# Patient Record
Sex: Male | Born: 1990 | Race: White | Hispanic: No | Marital: Married | Smoking: Never smoker
Health system: Southern US, Community
[De-identification: ages and names within clinical notes are randomized; demographics above are authoritative.]

## PROBLEM LIST (undated history)

## (undated) DIAGNOSIS — K219 Gastro-esophageal reflux disease without esophagitis: Secondary | ICD-10-CM

## (undated) DIAGNOSIS — Z8619 Personal history of other infectious and parasitic diseases: Secondary | ICD-10-CM

## (undated) DIAGNOSIS — K900 Celiac disease: Secondary | ICD-10-CM

## (undated) DIAGNOSIS — J45909 Unspecified asthma, uncomplicated: Secondary | ICD-10-CM

## (undated) HISTORY — DX: Unspecified asthma, uncomplicated: J45.909

## (undated) HISTORY — PX: WISDOM TOOTH EXTRACTION: SHX21

## (undated) HISTORY — DX: Celiac disease: K90.0

## (undated) HISTORY — PX: NO PAST SURGERIES: SHX2092

## (undated) HISTORY — DX: Personal history of other infectious and parasitic diseases: Z86.19

## (undated) HISTORY — DX: Gastro-esophageal reflux disease without esophagitis: K21.9

---
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
## 2013-10-22 ENCOUNTER — Ambulatory Visit (INDEPENDENT_AMBULATORY_CARE_PROVIDER_SITE_OTHER): Payer: 59 | Admitting: Family Medicine

## 2013-10-22 VITALS — BP 130/80 | HR 70 | Temp 99.3°F | Resp 16 | Ht 68.0 in | Wt 162.0 lb

## 2013-10-22 DIAGNOSIS — Z0000 Encounter for general adult medical examination without abnormal findings: Secondary | ICD-10-CM

## 2013-10-22 NOTE — Patient Instructions (Signed)
Cleared for participation

## 2013-10-22 NOTE — Progress Notes (Signed)
   Subjective:    Patient ID: Aaron Ramsey, male    DOB: March 31, 1991, 23 y.o.   MRN: [id number]  HPI Patient presents for employment physical for [employer]. No other concerns.  PMH: None PSH: Wisdom teeth Meds: None Allergies: NKDA FH: No DM, HTN, HLD, heart disease, stroke SH: Patient is currently single but is engaged. No tobacco. No alcohol. No illicit drug use. No concerns for STI and declines STD testing. Currently exercises by doing martial arts 4-5 days per week Diet: Lots of fruits and veggies, well balanced, does consume approx 4 sugary beverages per day.  Review of Systems  All other systems reviewed and are negative.       Objective:   Physical Exam  Constitutional: He is oriented to person, place, and time. He appears well-developed and well-nourished. No distress.  HENT:  Head: Normocephalic and atraumatic.  Right Ear: External ear normal.  Left Ear: External ear normal.  Mouth/Throat: Oropharynx is clear and moist. No oropharyngeal exudate.  Eyes: Conjunctivae are normal. Pupils are equal, round, and reactive to light. No scleral icterus.  Neck: Normal range of motion. Neck supple. No thyromegaly present.  Cardiovascular: Normal rate, regular rhythm, normal heart sounds and intact distal pulses.   No murmur heard. Pulmonary/Chest: Effort normal and breath sounds normal. No respiratory distress.  Abdominal: Soft. Bowel sounds are normal. He exhibits no distension and no mass. There is no tenderness. There is no rebound and no guarding.  Musculoskeletal: Normal range of motion.  Lymphadenopathy:    He has no cervical adenopathy.  Neurological: He is alert and oriented to person, place, and time.  Skin: Skin is warm and dry. He is not diaphoretic.  Psychiatric: He has a normal mood and affect. His behavior is normal.      Assessment & Plan:  #1. Pre-employment physical - Cleared for participation - Normal exam today - Continue exercise - Decrease  sugary beverages - No labs at this time given age and lack of risk factors. Would recommend labs such as FLP and A1c starting around age 45.

## 2013-10-23 NOTE — Progress Notes (Signed)
Reviewed documentation and agree w/ assessment and plan. Delman Cheadle, MD MPH

## 2017-10-09 ENCOUNTER — Ambulatory Visit: Payer: Self-pay | Admitting: *Deleted

## 2017-10-09 NOTE — Telephone Encounter (Signed)
Pt states he has chronic stomach issues and has been dealing with this issue for approximately 6-8 years. Pt states pain has increased in severity over the last 4 days and describes pain as a stabbing in the center of the lower abdomen. Pt states he has nausea and feels lightheaded at times but he does not experience any changes in vision and is able to walk without difficulty. Pt states that eating seems to make pain worse, but he has been able to tolerate food with out vomiting. Pt states he has diarrhea 5-7 times/day and states he has had this issue off and on for the past 6-8 years as well. Pt has not been seen by a physician regarding current issues and has not been diagnosed with a disease related to stomach issues. New pt appt made for 1/18. Pt advised of home care measures such as Pepto Bismol and Imodium to help diarrhea. Pt advised to seek treatment in ED if pain begins to worsen or if new symptoms develop. Pt verbalized understanding.  Reason for Disposition . Abdominal pain is a chronic symptom (recurrent or ongoing AND present > 4 weeks)  Answer Assessment - Initial Assessment Questions 1. LOCATION: "Where does it hurt?"      Lower stomach in the center 2. RADIATION: "Does the pain shoot anywhere else?" (e.g., chest, back)     No 3. ONSET: "When did the pain begin?" (Minutes, hours or days ago)      Approximately 4 days ago 4. SUDDEN: "Gradual or sudden onset?"     Sudden onset 5. PATTERN "Does the pain come and go, or is it constant?"    - If constant: "Is it getting better, staying the same, or worsening?"      (Note: Constant means the pain never goes away completely; most serious pain is constant and it progresses)     - If intermittent: "How long does it last?" "Do you have pain now?"     (Note: Intermittent means the pain goes away completely between bouts)     Comes and goes 6. SEVERITY: "How bad is the pain?"  (e.g., Scale 1-10; mild, moderate, or severe)    - MILD (1-3):  doesn't interfere with normal activities, abdomen soft and not tender to touch     - MODERATE (4-7): interferes with normal activities or awakens from sleep, tender to touch     - SEVERE (8-10): excruciating pain, doubled over, unable to do any normal activities       Moderate 6 7. RECURRENT SYMPTOM: "Have you ever had this type of abdominal pain before?" If so, ask: "When was the last time?" and "What happened that time?"      Yes has been dealing with for the last 6-8 8. CAUSE: "What do you think is causing the abdominal pain?"     Unsure 9. RELIEVING/AGGRAVATING FACTORS: "What makes it better or worse?" (e.g., movement, antacids, bowel movement)     No 10. OTHER SYMPTOMS: "Has there been any vomiting, diarrhea, constipation, or urine problems?"       Diarrhea 5-7/day  Protocols used: ABDOMINAL PAIN - MALE-A-AH

## 2017-10-12 ENCOUNTER — Encounter: Payer: Self-pay | Admitting: Physician Assistant

## 2017-10-12 ENCOUNTER — Ambulatory Visit: Payer: BLUE CROSS/BLUE SHIELD | Admitting: Physician Assistant

## 2017-10-12 ENCOUNTER — Other Ambulatory Visit: Payer: Self-pay

## 2017-10-12 ENCOUNTER — Encounter: Payer: Self-pay | Admitting: Gastroenterology

## 2017-10-12 VITALS — BP 118/70 | HR 65 | Temp 98.4°F | Resp 14 | Ht 68.0 in | Wt 141.0 lb

## 2017-10-12 DIAGNOSIS — Z0000 Encounter for general adult medical examination without abnormal findings: Secondary | ICD-10-CM

## 2017-10-12 DIAGNOSIS — R1084 Generalized abdominal pain: Secondary | ICD-10-CM

## 2017-10-12 DIAGNOSIS — G8929 Other chronic pain: Secondary | ICD-10-CM | POA: Diagnosis not present

## 2017-10-12 LAB — LIPID PANEL
Cholesterol: 127 mg/dL (ref 0–200)
HDL: 44 mg/dL (ref >39.00)
LDL Cholesterol: 61 mg/dL (ref 0–99)
NonHDL: 82.91
Total CHOL/HDL Ratio: 3
Triglycerides: 109 mg/dL (ref 0.0–149.0)
VLDL: 21.8 mg/dL (ref 0.0–40.0)

## 2017-10-12 LAB — POCT URINALYSIS DIPSTICK
Bilirubin, UA: NEGATIVE
Blood, UA: NEGATIVE
Glucose, UA: NEGATIVE
Ketones, UA: NEGATIVE
Leukocytes, UA: NEGATIVE
Nitrite, UA: NEGATIVE
Protein, UA: NEGATIVE
Spec Grav, UA: 1.025 (ref 1.010–1.025)
Urobilinogen, UA: 0.2 E.U./dL
pH, UA: 5 (ref 5.0–8.0)

## 2017-10-12 LAB — CBC WITH DIFFERENTIAL/PLATELET
Basophils Absolute: 0 10*3/uL (ref 0.0–0.1)
Basophils Relative: 0.6 % (ref 0.0–3.0)
Eosinophils Absolute: 0.2 10*3/uL (ref 0.0–0.7)
Eosinophils Relative: 2.5 % (ref 0.0–5.0)
HCT: 43.6 % (ref 39.0–52.0)
Hemoglobin: 14.8 g/dL (ref 13.0–17.0)
Lymphocytes Relative: 37.2 % (ref 12.0–46.0)
Lymphs Abs: 2.3 10*3/uL (ref 0.7–4.0)
MCHC: 34 g/dL (ref 30.0–36.0)
MCV: 88.1 fl (ref 78.0–100.0)
Monocytes Absolute: 0.8 10*3/uL (ref 0.1–1.0)
Monocytes Relative: 12.7 % — ABNORMAL HIGH (ref 3.0–12.0)
Neutro Abs: 2.9 10*3/uL (ref 1.4–7.7)
Neutrophils Relative %: 47 % (ref 43.0–77.0)
Platelets: 203 10*3/uL (ref 150.0–400.0)
RBC: 4.95 Mil/uL (ref 4.22–5.81)
RDW: 13 % (ref 11.5–15.5)
WBC: 6.2 10*3/uL (ref 4.0–10.5)

## 2017-10-12 LAB — COMPREHENSIVE METABOLIC PANEL
ALT: 9 U/L (ref 0–53)
AST: 13 U/L (ref 0–37)
Albumin: 4.7 g/dL (ref 3.5–5.2)
Alkaline Phosphatase: 71 U/L (ref 39–117)
BUN: 15 mg/dL (ref 6–23)
CO2: 35 mEq/L — ABNORMAL HIGH (ref 19–32)
Calcium: 9.5 mg/dL (ref 8.4–10.5)
Chloride: 99 mEq/L (ref 96–112)
Creatinine, Ser: 0.92 mg/dL (ref 0.40–1.50)
GFR: 106.61 mL/min (ref >60.00)
Glucose, Bld: 80 mg/dL (ref 70–99)
Potassium: 4 mEq/L (ref 3.5–5.1)
Sodium: 139 mEq/L (ref 135–145)
Total Bilirubin: 0.5 mg/dL (ref 0.2–1.2)
Total Protein: 7 g/dL (ref 6.0–8.3)

## 2017-10-12 LAB — LIPASE: Lipase: 25 U/L (ref 11.0–59.0)

## 2017-10-12 LAB — HEMOGLOBIN A1C: Hgb A1c MFr Bld: 5.5 % (ref 4.6–6.5)

## 2017-10-12 LAB — TSH: TSH: 1.23 u[IU]/mL (ref 0.35–4.50)

## 2017-10-12 MED ORDER — PANTOPRAZOLE SODIUM 40 MG PO TBEC: 40.0000 mg | DELAYED_RELEASE_TABLET | Freq: Every day | ORAL | 3 refills | Status: DC

## 2017-10-12 NOTE — Assessment & Plan Note (Signed)
Labs today to include CBC w diff, CMP, Lipase. Referral to GI placed. Will start 2 week trial of Pantoprazole to help with suspected silent GERD.

## 2017-10-12 NOTE — Progress Notes (Signed)
Patient presents to clinic today to establish care. Is requesting CPE today.  Chronic Issues: Patient endorses long-standing history of loose, frequent stools with cramping in the central abdomen. States this was intermittent and associated with fecal urgency, needing to rush to the bathroom after eating. Denies heart burn or indigestion. Denies melena or hematochezia. Denies tenesmus. Endorses exacerbation of symptoms 4 days ago, endorses severe pain described as a stabbing pain. Noted some pain with palpation in epigastric region but otherwise pain was in lower abdomen. Does note a history of mucoid stools a few years ago. Has never had a workup.   Health Maintenance: Immunizations -- up-to-date per patient.   Past Medical History:  Diagnosis Date  . History of chickenpox     Past Surgical History:  Procedure Laterality Date  . NO PAST SURGERIES      No current outpatient medications on file prior to visit.   No current facility-administered medications on file prior to visit.     No Known Allergies  Family History  Problem Relation Age of Onset  . Healthy Mother   . Healthy Father   . Healthy Brother   . Healthy Brother   . Cancer Maternal Grandfather        Prostate    Social History   Socioeconomic History  . Marital status: Married    Spouse name: Not on file  . Number of children: Not on file  . Years of education: Not on file  . Highest education level: Not on file  Social Needs  . Financial resource strain: Not on file  . Food insecurity - worry: Not on file  . Food insecurity - inability: Not on file  . Transportation needs - medical: Not on file  . Transportation needs - non-medical: Not on file  Occupational History  . Not on file  Tobacco Use  . Smoking status: Never Smoker  . Smokeless tobacco: Never Used  Substance and Sexual Activity  . Alcohol use: Yes    Comment: social  . Drug use: No  . Sexual activity: Yes    Partners: Female   Comment: Wife  Other Topics Concern  . Not on file  Social History Narrative  . Not on file   Review of Systems  Constitutional: Negative for fever, malaise/fatigue and weight loss.  HENT: Negative for ear discharge, ear pain, hearing loss and tinnitus.   Eyes: Negative for blurred vision, double vision, photophobia and pain.  Respiratory: Negative for cough and shortness of breath.   Cardiovascular: Negative for chest pain and palpitations.  Gastrointestinal: Positive for abdominal pain and diarrhea. Negative for blood in stool, constipation, heartburn, melena, nausea and vomiting.  Genitourinary: Negative for dysuria, flank pain, frequency, hematuria and urgency.  Musculoskeletal: Negative for falls.  Neurological: Negative for dizziness, loss of consciousness and headaches.  Endo/Heme/Allergies: Negative for environmental allergies.  Psychiatric/Behavioral: Negative for depression, hallucinations, substance abuse and suicidal ideas. The patient is not nervous/anxious and does not have insomnia.    BP 118/70   Pulse 65   Temp 98.4 F (36.9 C) (Oral)   Resp 14   Ht 5' 8"  (1.727 m)   Wt 141 lb (64 kg)   SpO2 98%   BMI 21.44 kg/m   Physical Exam  Constitutional: He is oriented to person, place, and time and well-developed, well-nourished, and in no distress.  HENT:  Head: Normocephalic and atraumatic.  Right Ear: External ear normal.  Left Ear: External ear normal.  Nose: Nose normal.  Mouth/Throat: Oropharynx is clear and moist. No oropharyngeal exudate.  Eyes: Conjunctivae and EOM are normal. Pupils are equal, round, and reactive to light.  Neck: Neck supple. No thyromegaly present.  Cardiovascular: Normal rate, regular rhythm, normal heart sounds and intact distal pulses.  Pulmonary/Chest: Effort normal and breath sounds normal. No respiratory distress. He has no wheezes. He has no rales. He exhibits no tenderness.  Abdominal: Soft. Bowel sounds are normal. He exhibits no  distension and no mass. There is tenderness in the epigastric area and left upper quadrant. There is no rebound and no guarding.  Genitourinary: Testes/scrotum normal and penis normal. No discharge found.  Lymphadenopathy:    He has no cervical adenopathy.  Neurological: He is alert and oriented to person, place, and time.  Skin: Skin is warm and dry. No rash noted.  Psychiatric: Affect normal.  Vitals reviewed.  Assessment/Plan: Visit for preventive health examination Depression screen negative. Health Maintenance reviewed. Preventive schedule discussed and handout given in AVS. Will obtain fasting labs today.   Abdominal pain, chronic, generalized Labs today to include CBC w diff, CMP, Lipase. Referral to GI placed. Will start 2 week trial of Pantoprazole to help with suspected silent GERD.    Leeanne Rio, PA-C

## 2017-10-12 NOTE — Patient Instructions (Signed)
Please go to the lab today for blood work.  I will call you with your results. We will alter treatment regimen(s) if indicated by your results.   I want you to start a trial of Pantoprazole for 2 weeks to see if it starts to help with the upper abdominal tenderness.  I am getting you in with a Gastroenterologist for further assessment.    Preventive Care 18-39 Years, Male Preventive care refers to lifestyle choices and visits with your health care provider that can promote health and wellness. What does preventive care include?  A yearly physical exam. This is also called an annual well check.  Dental exams once or twice a year.  Routine eye exams. Ask your health care provider how often you should have your eyes checked.  Personal lifestyle choices, including: ? Daily care of your teeth and gums. ? Regular physical activity. ? Eating a healthy diet. ? Avoiding tobacco and drug use. ? Limiting alcohol use. ? Practicing safe sex. What happens during an annual well check? The services and screenings done by your health care provider during your annual well check will depend on your age, overall health, lifestyle risk factors, and family history of disease. Counseling Your health care provider may ask you questions about your:  Alcohol use.  Tobacco use.  Drug use.  Emotional well-being.  Home and relationship well-being.  Sexual activity.  Eating habits.  Work and work Statistician.  Screening You may have the following tests or measurements:  Height, weight, and BMI.  Blood pressure.  Lipid and cholesterol levels. These may be checked every 5 years starting at age 73.  Diabetes screening. This is done by checking your blood sugar (glucose) after you have not eaten for a while (fasting).  Skin check.  Hepatitis C blood test.  Hepatitis B blood test.  Sexually transmitted disease (STD) testing.  Discuss your test results, treatment options, and if  necessary, the need for more tests with your health care provider. Vaccines Your health care provider may recommend certain vaccines, such as:  Influenza vaccine. This is recommended every year.  Tetanus, diphtheria, and acellular pertussis (Tdap, Td) vaccine. You may need a Td booster every 10 years.  Varicella vaccine. You may need this if you have not been vaccinated.  HPV vaccine. If you are 10 or younger, you may need three doses over 6 months.  Measles, mumps, and rubella (MMR) vaccine. You may need at least one dose of MMR.You may also need a second dose.  Pneumococcal 13-valent conjugate (PCV13) vaccine. You may need this if you have certain conditions and have not been vaccinated.  Pneumococcal polysaccharide (PPSV23) vaccine. You may need one or two doses if you smoke cigarettes or if you have certain conditions.  Meningococcal vaccine. One dose is recommended if you are age 50-21 years and a first-year college student living in a residence hall, or if you have one of several medical conditions. You may also need additional booster doses.  Hepatitis A vaccine. You may need this if you have certain conditions or if you travel or work in places where you may be exposed to hepatitis A.  Hepatitis B vaccine. You may need this if you have certain conditions or if you travel or work in places where you may be exposed to hepatitis B.  Haemophilus influenzae type b (Hib) vaccine. You may need this if you have certain risk factors.  Talk to your health care provider about which screenings and vaccines you  need and how often you need them. This information is not intended to replace advice given to you by your health care provider. Make sure you discuss any questions you have with your health care provider. Document Released: 11/07/2001 Document Revised: 05/31/2016 Document Reviewed: 07/13/2015 Elsevier Interactive Patient Education  [employer].

## 2017-10-12 NOTE — Assessment & Plan Note (Signed)
Depression screen negative. Health Maintenance reviewed. Preventive schedule discussed and handout given in AVS. Will obtain fasting labs today.  

## 2017-10-16 ENCOUNTER — Telehealth: Payer: Self-pay | Admitting: Physician Assistant

## 2017-10-16 NOTE — Telephone Encounter (Signed)
Pt concerned new rx for Protonix states side effect of abdominal pain. Pt states "That's why I was prescribed it."  Ordered 10/12/17. Has not started med yet. Instructed to take med as ordered and to call back if symptoms worsen.

## 2017-11-21 ENCOUNTER — Ambulatory Visit: Payer: BLUE CROSS/BLUE SHIELD | Admitting: Gastroenterology

## 2017-11-21 ENCOUNTER — Encounter: Payer: Self-pay | Admitting: Gastroenterology

## 2017-11-21 ENCOUNTER — Encounter (INDEPENDENT_AMBULATORY_CARE_PROVIDER_SITE_OTHER): Payer: Self-pay

## 2017-11-21 ENCOUNTER — Ambulatory Visit (INDEPENDENT_AMBULATORY_CARE_PROVIDER_SITE_OTHER): Payer: BLUE CROSS/BLUE SHIELD | Admitting: Gastroenterology

## 2017-11-21 VITALS — BP 100/60 | HR 72 | Ht 68.0 in | Wt 145.0 lb

## 2017-11-21 DIAGNOSIS — R109 Unspecified abdominal pain: Secondary | ICD-10-CM

## 2017-11-21 NOTE — Patient Instructions (Addendum)
You will be set up for a CT scan of abdomen and pelvis with IV and oral contrast (for lower abdominal pain).  You have been scheduled for a CT scan of the abdomen and pelvis at [facility] ([address]---this is in the same building as Press photographer).   You are scheduled on 11/29/17 at 830 am. You should arrive 15 minutes prior to your appointment time for registration. Please follow the written instructions below on the day of your exam:  WARNING: IF YOU ARE ALLERGIC TO IODINE/X-RAY DYE, PLEASE NOTIFY RADIOLOGY IMMEDIATELY AT [phone]! YOU WILL BE GIVEN A 13 HOUR PREMEDICATION PREP.  1) Do not eat or drink anything after 430 am (4 hours prior to your test) 2) You have been given 2 bottles of oral contrast to drink. The solution may taste better if refrigerated, but do NOT add ice or any other liquid to this solution. Shake well before drinking.    Drink 1 bottle of contrast @ 630 am (2 hours prior to your exam)  Drink 1 bottle of contrast @ 730 am (1 hour prior to your exam)  You may take any medications as prescribed with a small amount of water except for the following: Metformin, Glucophage, Glucovance, Avandamet, Riomet, Fortamet, Actoplus Met, Janumet, Glumetza or Metaglip. The above medications must be held the day of the exam AND 48 hours after the exam.  The purpose of you drinking the oral contrast is to aid in the visualization of your intestinal tract. The contrast solution may cause some diarrhea. Before your exam is started, you will be given a small amount of fluid to drink. Depending on your individual set of symptoms, you may also receive an intravenous injection of x-ray contrast/dye. Plan on being at [facility] for 30 minutes or longer, depending on the type of exam you are having performed.  This test typically takes 30-45 minutes to complete.  If you have any questions regarding your exam or if you need to reschedule, you may call the CT  department at [phone] between the hours of 8:00 am and 5:00 pm, Monday-Friday.  ________________________________________________________________________ Aaron Ramsey protonix (pantoprazole) at OTC strenght (63m, one pill once daily) for your upper abdominal pain.  Normal BMI (Body Mass Index- based on height and weight) is between 19 and 25. Your BMI today is Body mass index is 22.05 kg/m. .Marland KitchenPlease consider follow up  regarding your BMI with your Primary Care Provider.

## 2017-11-21 NOTE — Progress Notes (Signed)
HPI: This is a very pleasant 27 year old man who was referred to me by Brunetta Jeans, PA-C  to evaluate abdominal pain.    Chief complaint is abdominal pain  He has 2 distinct abdominal pains.  The first is epigastric.  Constant.  Started about 6 weeks ago around the time that he was taking NSAIDs for a work-related injury.  He does have some mild pyrosis or acid regurg it times.  This epigastric pain completely resolved when he began taking proton pump inhibitor once daily and it returned when he stopped that antiacid medicine.  The second abdominal pain is lower a bit on the left side.  It also started about 6 weeks ago.  Certain foods may make it worse.  He has been avoiding some foods and actually lost about 5 pounds because of this.  That pain is constant it is stabbing.  Soda, sugery foods cause lower pain to worsen.  His bowels have been erratic for years.  Seems like he alternates between constipation and diarrhea.  No real changes there.  No overt GI bleeding  Lost 5 pounds  Old Data Reviewed: Lab tests 09/2017: CBC, complete metabolic profile, TSH, lipase were all essentially normal.   Review of systems: Pertinent positive and negative review of systems were noted in the above HPI section. All other review negative.   Past Medical History:  Diagnosis Date  . History of chickenpox     Past Surgical History:  Procedure Laterality Date  . NO PAST SURGERIES      Current Outpatient Medications  Medication Sig Dispense Refill  . pantoprazole (PROTONIX) 40 MG tablet Take 1 tablet (40 mg total) by mouth daily. (Patient not taking: Reported on 25/27/2019) 30 tablet 3   No current facility-administered medications for this visit.     Allergies as of 11/21/2017  . (No Known Allergies)    Family History  Problem Relation Age of Onset  . Healthy Mother   . Healthy Father   . Healthy Brother   . Healthy Brother   . Cancer Maternal Grandfather        Prostate     Social History   Socioeconomic History  . Marital status: Married    Spouse name: Not on file  . Number of children: Not on file  . Years of education: Not on file  . Highest education level: Not on file  Social Needs  . Financial resource strain: Not on file  . Food insecurity - worry: Not on file  . Food insecurity - inability: Not on file  . Transportation needs - medical: Not on file  . Transportation needs - non-medical: Not on file  Occupational History  . Not on file  Tobacco Use  . Smoking status: Never Smoker  . Smokeless tobacco: Never Used  Substance and Sexual Activity  . Alcohol use: Yes    Comment: social  . Drug use: No  . Sexual activity: Yes    Partners: Female    Comment: Wife  Other Topics Concern  . Not on file  Social History Narrative  . Not on file     Physical Exam: There were no vitals taken for this visit. Constitutional: generally well-appearing Psychiatric: alert and oriented x3 Eyes: extraocular movements intact Mouth: oral pharynx moist, no lesions Neck: supple no lymphadenopathy Cardiovascular: heart regular rate and rhythm Lungs: clear to auscultation bilaterally Abdomen: soft, mildly tender left lower abdomen where there is a slight protuberance, nondistended, no obvious ascites, no peritoneal  signs, normal bowel sounds Extremities: no lower extremity edema bilaterally Skin: no lesions on visible extremities   Assessment and plan: 27 y.o. male with epigastric pain, lower abdominal pain  First the epigastric pain may have been from Aleve that he was taking for a work-related injury.  It is clearly responded to proton pump inhibitor and I have asked him to restart that at over-the-counter strength for now.  Second his lower abdominal pain is less clear to me.  He does have a mild fullness in his abdomen at the site of the pain.  I like to investigate that further with CT scan abdomen and pelvis with IV and oral contrast.  If it is  not helpful then he may need further testing, upper endoscopy perhaps colonoscopy.    Please see the "Patient Instructions" section for addition details about the plan.   Owens Loffler, MD [facility] 11/21/2017, 11:23 AM  Cc: Brunetta Jeans, PA-C

## 2017-11-29 ENCOUNTER — Inpatient Hospital Stay: Admission: RE | Admit: 2017-11-29 | Payer: BLUE CROSS/BLUE SHIELD | Source: Ambulatory Visit

## 2017-12-05 ENCOUNTER — Telehealth: Payer: Self-pay | Admitting: Gastroenterology

## 2017-12-05 ENCOUNTER — Ambulatory Visit (INDEPENDENT_AMBULATORY_CARE_PROVIDER_SITE_OTHER)
Admission: RE | Admit: 2017-12-05 | Discharge: 2017-12-05 | Disposition: A | Discharge status: Home / Self Care | Payer: BLUE CROSS/BLUE SHIELD | Source: Ambulatory Visit | Attending: Gastroenterology | Admitting: Gastroenterology

## 2017-12-05 DIAGNOSIS — R109 Unspecified abdominal pain: Secondary | ICD-10-CM | POA: Diagnosis not present

## 2017-12-05 MED ORDER — IOPAMIDOL (ISOVUE-300) INJECTION 61%
100.0000 mL | Freq: Once | INTRAVENOUS | Status: AC | PRN
  Administered 2017-12-05: 100 mL via INTRAVENOUS

## 2017-12-05 NOTE — Telephone Encounter (Signed)
The pt has been advised that we will call him with results as soon as reviewed

## 2017-12-20 ENCOUNTER — Telehealth: Payer: Self-pay

## 2017-12-20 ENCOUNTER — Encounter: Payer: Self-pay | Admitting: Physician Assistant

## 2017-12-20 NOTE — Telephone Encounter (Signed)
Called patient to let him know about the packed of paperwork that he needed filled out for his work for [employer]. I spoke with [name] and [name] stated that the patient needed to set up his MyChart to send [name] a message with the recent tests and problems that he is experiencing. Patient agreed to set this up and will send [name] a message.

## 2017-12-24 ENCOUNTER — Encounter: Payer: Self-pay | Admitting: Physician Assistant

## 2017-12-24 ENCOUNTER — Ambulatory Visit: Payer: BLUE CROSS/BLUE SHIELD | Admitting: Physician Assistant

## 2017-12-24 ENCOUNTER — Other Ambulatory Visit: Payer: Self-pay

## 2017-12-24 DIAGNOSIS — R1084 Generalized abdominal pain: Secondary | ICD-10-CM

## 2017-12-24 DIAGNOSIS — G8929 Other chronic pain: Secondary | ICD-10-CM

## 2017-12-24 NOTE — Assessment & Plan Note (Addendum)
Will prepare FMLA to cover recent missed work and upcoming appointments/procedures.  Continue care as directed by Gastroenterology. Follow-up as scheduled.

## 2017-12-24 NOTE — Progress Notes (Signed)
Patient presents to clinic today to discuss need for [facility] paperwork completion.  Patient with history of GERD and chronic abdominal pain. Was referred to Gastroenterology from this practice due to ongoing issue. Patient was evaluated by specialist on 11/21/17. CT of abdomen was obtained on 12/05/2017 and negative for acute finding. Patient unfortunately had a severe reaction to the oral contract including severe nausea/vomiting/myalgias.  Symptoms were so significant patient required evaluation at Urgent Care. He was seen and given medication to help with nausea/vomiting. Was written out of work from 3/14-3/16 to allow time for recovery. Patient is scheduled for EGD pre-procedure visit on 01/18/18 and the actual EGD on 01/25/18. Patient is needing intermittent FMLA for upcoming appointments and procedures.   Past Medical History:  Diagnosis Date  . History of chickenpox     Current Outpatient Medications on File Prior to Visit  Medication Sig Dispense Refill  . pantoprazole (PROTONIX) 40 MG tablet Take 1 tablet (40 mg total) by mouth daily. 30 tablet 3   No current facility-administered medications on file prior to visit.     Allergies  Allergen Reactions  . Other Nausea And Vomiting    Oral contrast -- severe nausea/vomiting.     Family History  Problem Relation Age of Onset  . Healthy Mother   . Healthy Father   . Healthy Brother   . Healthy Brother   . Cancer Maternal Grandfather        Prostate    Social History   Socioeconomic History  . Marital status: Married    Spouse name: Not on file  . Number of children: Not on file  . Years of education: Not on file  . Highest education level: Not on file  Occupational History  . Not on file  Social Needs  . Financial resource strain: Not on file  . Food insecurity:    Worry: Not on file    Inability: Not on file  . Transportation needs:    Medical: Not on file    Non-medical: Not on file  Tobacco Use  . Smoking status:  Never Smoker  . Smokeless tobacco: Never Used  Substance and Sexual Activity  . Alcohol use: Yes    Comment: social  . Drug use: No  . Sexual activity: Yes    Partners: Female    Comment: Wife  Lifestyle  . Physical activity:    Days per week: Not on file    Minutes per session: Not on file  . Stress: Not on file  Relationships  . Social connections:    Talks on phone: Not on file    Gets together: Not on file    Attends religious service: Not on file    Active member of club or organization: Not on file    Attends meetings of clubs or organizations: Not on file    Relationship status: Not on file  Other Topics Concern  . Not on file  Social History Narrative  . Not on file   Review of Systems - See HPI.  All other ROS are negative.  BP 104/64   Pulse 69   Temp 97.8 F (36.6 C) (Oral)   Resp 14   Ht 5' 8"  (1.727 m)   Wt 144 lb (65.3 kg)   SpO2 98%   BMI 21.90 kg/m   Physical Exam  Constitutional: He is well-developed, well-nourished, and in no distress.  HENT:  Head: Normocephalic and atraumatic.  Eyes: Conjunctivae are normal.  Neck: Neck  supple.  Pulmonary/Chest: Effort normal.  Neurological: He is alert.  Vitals reviewed.   Recent Results (from the past 2160 hour(s))  POCT urinalysis dipstick     Status: Normal   Collection Time: 10/12/17 11:23 AM  Result Value Ref Range   Color, UA yellow    Clarity, UA clear    Glucose, UA negative    Bilirubin, UA negative    Ketones, UA negative    Spec Grav, UA 1.025 1.010 - 1.025   Blood, UA negative    pH, UA 5.0 5.0 - 8.0   Protein, UA negative    Urobilinogen, UA 0.2 0.2 or 1.0 E.U./dL   Nitrite, UA negative    Leukocytes, UA Negative Negative   Appearance     Odor    CBC with Differential/Platelet     Status: Abnormal   Collection Time: 10/12/17 11:30 AM  Result Value Ref Range   WBC 6.2 4.0 - 10.5 K/uL   RBC 4.95 4.22 - 5.81 Mil/uL   Hemoglobin 14.8 13.0 - 17.0 g/dL   HCT 43.6 39.0 - 52.0 %    MCV 88.1 78.0 - 100.0 fl   MCHC 34.0 30.0 - 36.0 g/dL   RDW 13.0 11.5 - 15.5 %   Platelets 203.0 150.0 - 400.0 K/uL   Neutrophils Relative % 47.0 43.0 - 77.0 %   Lymphocytes Relative 37.2 12.0 - 46.0 %   Monocytes Relative 12.7 (H) 3.0 - 12.0 %   Eosinophils Relative 2.5 0.0 - 5.0 %   Basophils Relative 0.6 0.0 - 3.0 %   Neutro Abs 2.9 1.4 - 7.7 K/uL   Lymphs Abs 2.3 0.7 - 4.0 K/uL   Monocytes Absolute 0.8 0.1 - 1.0 K/uL   Eosinophils Absolute 0.2 0.0 - 0.7 K/uL   Basophils Absolute 0.0 0.0 - 0.1 K/uL  Comprehensive metabolic panel     Status: Abnormal   Collection Time: 10/12/17 11:30 AM  Result Value Ref Range   Sodium 139 135 - 145 mEq/L   Potassium 4.0 3.5 - 5.1 mEq/L   Chloride 99 96 - 112 mEq/L   CO2 35 (H) 19 - 32 mEq/L   Glucose, Bld 80 70 - 99 mg/dL   BUN 15 6 - 23 mg/dL   Creatinine, Ser 0.92 0.40 - 1.50 mg/dL   Total Bilirubin 0.5 0.2 - 1.2 mg/dL   Alkaline Phosphatase 71 39 - 117 U/L   AST 13 0 - 37 U/L   ALT 9 0 - 53 U/L   Total Protein 7.0 6.0 - 8.3 g/dL   Albumin 4.7 3.5 - 5.2 g/dL   Calcium 9.5 8.4 - 10.5 mg/dL   GFR 106.61 >60.00 mL/min  Lipid panel     Status: None   Collection Time: 10/12/17 11:30 AM  Result Value Ref Range   Cholesterol 127 0 - 200 mg/dL    Comment: ATP III Classification       Desirable:  < 200 mg/dL               Borderline High:  200 - 239 mg/dL          High:  > = 240 mg/dL   Triglycerides 109.0 0.0 - 149.0 mg/dL    Comment: Normal:  <150 mg/dLBorderline High:  150 - 199 mg/dL   HDL 44.00 >39.00 mg/dL   VLDL 21.8 0.0 - 40.0 mg/dL   LDL Cholesterol 61 0 - 99 mg/dL   Total CHOL/HDL Ratio 3     Comment:  Men          Women1/2 Average Risk     3.4          3.3Average Risk          5.0          4.42X Average Risk          9.6          7.13X Average Risk          15.0          11.0                       NonHDL 82.91     Comment: NOTE:  Non-HDL goal should be 30 mg/dL higher than patient's LDL goal (i.e. LDL goal of < 70  mg/dL, would have non-HDL goal of < 100 mg/dL)  Hemoglobin A1c     Status: None   Collection Time: 10/12/17 11:30 AM  Result Value Ref Range   Hgb A1c MFr Bld 5.5 4.6 - 6.5 %    Comment: Glycemic Control Guidelines for People with Diabetes:Non Diabetic:  <6%Goal of Therapy: <7%Additional Action Suggested:  >8%   TSH     Status: None   Collection Time: 10/12/17 11:30 AM  Result Value Ref Range   TSH 1.23 0.35 - 4.50 uIU/mL  Lipase     Status: None   Collection Time: 10/12/17 11:30 AM  Result Value Ref Range   Lipase 25.0 11.0 - 59.0 U/L    Assessment/Plan: Abdominal pain, chronic, generalized Will prepare FMLA to cover recent missed work and upcoming appointments/procedures.  Continue care as directed by Gastroenterology. Follow-up as scheduled.     Leeanne Rio, PA-C

## 2017-12-24 NOTE — Patient Instructions (Signed)
I am taking care of your FMLA paperwork. I should have completed by early afternoon and will fax in for you.  Please continue the Protonix.  Continue keeping a well-balanced diet. Avoid trigger foods for reflux.   Follow-up with Gastroenterology as scheduled for visits and procedures.

## 2018-01-07 ENCOUNTER — Other Ambulatory Visit: Payer: Self-pay

## 2018-01-07 ENCOUNTER — Encounter: Payer: Self-pay | Admitting: Physician Assistant

## 2018-01-07 ENCOUNTER — Encounter (HOSPITAL_BASED_OUTPATIENT_CLINIC_OR_DEPARTMENT_OTHER): Payer: Self-pay | Admitting: Emergency Medicine

## 2018-01-07 ENCOUNTER — Emergency Department (HOSPITAL_BASED_OUTPATIENT_CLINIC_OR_DEPARTMENT_OTHER)
Admission: EM | Admit: 2018-01-07 | Discharge: 2018-01-08 | Discharge status: Against Medical Advice | Payer: BLUE CROSS/BLUE SHIELD | Attending: Emergency Medicine | Admitting: Emergency Medicine

## 2018-01-07 DIAGNOSIS — Z79899 Other long term (current) drug therapy: Secondary | ICD-10-CM | POA: Diagnosis not present

## 2018-01-07 DIAGNOSIS — R1012 Left upper quadrant pain: Secondary | ICD-10-CM | POA: Diagnosis present

## 2018-01-07 DIAGNOSIS — R110 Nausea: Secondary | ICD-10-CM | POA: Insufficient documentation

## 2018-01-07 LAB — URINALYSIS, ROUTINE W REFLEX MICROSCOPIC
BILIRUBIN URINE: NEGATIVE
GLUCOSE, UA: NEGATIVE mg/dL
HGB URINE DIPSTICK: NEGATIVE
Ketones, ur: NEGATIVE mg/dL
Leukocytes, UA: NEGATIVE
Nitrite: NEGATIVE
PROTEIN: NEGATIVE mg/dL
Specific Gravity, Urine: 1.015 (ref 1.005–1.030)
pH: 6 (ref 5.0–8.0)

## 2018-01-07 LAB — COMPREHENSIVE METABOLIC PANEL
ALT: 15 U/L — AB (ref 17–63)
AST: 16 U/L (ref 15–41)
Albumin: 4.5 g/dL (ref 3.5–5.0)
Alkaline Phosphatase: 63 U/L (ref 38–126)
Anion gap: 10 (ref 5–15)
BUN: 15 mg/dL (ref 6–20)
CHLORIDE: 104 mmol/L (ref 101–111)
CO2: 24 mmol/L (ref 22–32)
CREATININE: 0.98 mg/dL (ref 0.61–1.24)
Calcium: 9.1 mg/dL (ref 8.9–10.3)
GFR calc Af Amer: >60 mL/min (ref >60)
GFR calc non Af Amer: >60 mL/min (ref >60)
Glucose, Bld: 91 mg/dL (ref 65–99)
POTASSIUM: 3.8 mmol/L (ref 3.5–5.1)
SODIUM: 138 mmol/L (ref 135–145)
Total Bilirubin: 0.5 mg/dL (ref 0.3–1.2)
Total Protein: 7.5 g/dL (ref 6.5–8.1)

## 2018-01-07 LAB — CBC
HEMATOCRIT: 42 % (ref 39.0–52.0)
Hemoglobin: 14.4 g/dL (ref 13.0–17.0)
MCH: 29.3 pg (ref 26.0–34.0)
MCHC: 34.3 g/dL (ref 30.0–36.0)
MCV: 85.4 fL (ref 78.0–100.0)
PLATELETS: 207 10*3/uL (ref 150–400)
RBC: 4.92 MIL/uL (ref 4.22–5.81)
RDW: 12.5 % (ref 11.5–15.5)
WBC: 7.1 10*3/uL (ref 4.0–10.5)

## 2018-01-07 LAB — LIPASE, BLOOD: LIPASE: 36 U/L (ref 11–51)

## 2018-01-07 NOTE — ED Triage Notes (Addendum)
Upper abd pain x 6 months. Seen by GI, had neg CT. Is scheduled for endoscopy. Pt states pain has been worse over the last week causing him to miss multiple days of work. Denies vomiting. Endorses nausea. Also taking protonix.

## 2018-01-08 MED ORDER — SUCRALFATE 1 GM/10ML PO SUSP
1.0000 g | Freq: Once | ORAL | Status: AC
  Administered 2018-01-08: 1 g via ORAL
  Filled 2018-01-08: qty 10

## 2018-01-08 MED ORDER — SUCRALFATE 1 G PO TABS: 1.0000 g | ORAL_TABLET | Freq: Three times a day (TID) | ORAL | 0 refills | Status: DC

## 2018-01-08 NOTE — ED Notes (Addendum)
Awaiting dispo

## 2018-01-08 NOTE — ED Provider Notes (Signed)
[facility] Provider Note: Georgena Spurling, MD, FACEP  CSN: [id number] MRN: [id number] ARRIVAL: 01/07/18 at 2124 ROOM: [facility]  Abdominal Pain   HISTORY OF PRESENT ILLNESS  01/08/18 12:01 AM Aaron Ramsey is a 27 y.o. male with a 55-monthhistory of abdominal pain.  The abdominal pain is primarily located below the umbilicus.  He has been seen by his PCP and had a recent CT scan which was unremarkable.  He is here now with about a 1 week history of left upper quadrant pain which is different than the pain he had been having.  It is a dull pain which becomes sharp at times.  It is been associated with nausea but no vomiting.  Symptoms are moderate to severe and have kept him from work this week.  He cannot say if it is any better or worse with eating or drinking.  He has been on Protonix for about 2 months.  He also admits to chronic loose stools.  He has not taken anything besides the Protonix.   Past Medical History:  Diagnosis Date  . History of chickenpox     Past Surgical History:  Procedure Laterality Date  . NO PAST SURGERIES      Family History  Problem Relation Age of Onset  . Healthy Mother   . Healthy Father   . Healthy Brother   . Healthy Brother   . Cancer Maternal Grandfather        Prostate    Social History   Tobacco Use  . Smoking status: Never Smoker  . Smokeless tobacco: Never Used  Substance Use Topics  . Alcohol use: Yes    Comment: social  . Drug use: No    Prior to Admission medications   Medication Sig Start Date End Date Taking? Authorizing Provider  pantoprazole (PROTONIX) 40 MG tablet Take 1 tablet (40 mg total) by mouth daily. 10/12/17   MBrunetta Jeans PA-C    Allergies Other   REVIEW OF SYSTEMS  Negative except as noted here or in the History of Present Illness.   PHYSICAL EXAMINATION  Initial Vital Signs Blood pressure 124/65, pulse 66, temperature 99 F (37.2 C), temperature source  Oral, resp. rate 16, height 5' 8"  (1.727 m), weight 61.2 kg (135 lb), SpO2 100 %.  Examination General: Well-developed, well-nourished male in no acute distress; appearance consistent with age of record HENT: normocephalic; atraumatic Eyes: pupils equal, round and reactive to light; extraocular muscles intact Neck: supple Heart: regular rate and rhythm Lungs: clear to auscultation bilaterally Abdomen: soft; nondistended; left upper quadrant tenderness; no masses or hepatosplenomegaly; bowel sounds present Extremities: No deformity; full range of motion; pulses normal Neurologic: Awake, alert and oriented; motor function intact in all extremities and symmetric; no facial droop Skin: Warm and dry Psychiatric: Normal mood and affect   RESULTS  Summary of this visit's results, reviewed by myself:   EKG Interpretation  Date/Time:    Ventricular Rate:    PR Interval:    QRS Duration:   QT Interval:    QTC Calculation:   R Axis:     Text Interpretation:        Laboratory Studies: Results for orders placed or performed during the hospital encounter of 01/07/18 (from the past 24 hour(s))  Urinalysis, Routine w reflex microscopic     Status: None   Collection Time: 01/07/18  9:41 PM  Result Value Ref Range   Color, Urine YELLOW YELLOW  APPearance CLEAR CLEAR   Specific Gravity, Urine 1.015 1.005 - 1.030   pH 6.0 5.0 - 8.0   Glucose, UA NEGATIVE NEGATIVE mg/dL   Hgb urine dipstick NEGATIVE NEGATIVE   Bilirubin Urine NEGATIVE NEGATIVE   Ketones, ur NEGATIVE NEGATIVE mg/dL   Protein, ur NEGATIVE NEGATIVE mg/dL   Nitrite NEGATIVE NEGATIVE   Leukocytes, UA NEGATIVE NEGATIVE  Lipase, blood     Status: None   Collection Time: 01/07/18 10:42 PM  Result Value Ref Range   Lipase 36 11 - 51 U/L  Comprehensive metabolic panel     Status: Abnormal   Collection Time: 01/07/18 10:42 PM  Result Value Ref Range   Sodium 138 135 - 145 mmol/L   Potassium 3.8 3.5 - 5.1 mmol/L   Chloride  104 101 - 111 mmol/L   CO2 24 22 - 32 mmol/L   Glucose, Bld 91 65 - 99 mg/dL   BUN 15 6 - 20 mg/dL   Creatinine, Ser 0.98 0.61 - 1.24 mg/dL   Calcium 9.1 8.9 - 10.3 mg/dL   Total Protein 7.5 6.5 - 8.1 g/dL   Albumin 4.5 3.5 - 5.0 g/dL   AST 16 15 - 41 U/L   ALT 15 (L) 17 - 63 U/L   Alkaline Phosphatase 63 38 - 126 U/L   Total Bilirubin 0.5 0.3 - 1.2 mg/dL   GFR calc non Af Amer >60 >60 mL/min   GFR calc Af Amer >60 >60 mL/min   Anion gap 10 5 - 15  CBC     Status: None   Collection Time: 01/07/18 10:42 PM  Result Value Ref Range   WBC 7.1 4.0 - 10.5 K/uL   RBC 4.92 4.22 - 5.81 MIL/uL   Hemoglobin 14.4 13.0 - 17.0 g/dL   HCT 42.0 39.0 - 52.0 %   MCV 85.4 78.0 - 100.0 fL   MCH 29.3 26.0 - 34.0 pg   MCHC 34.3 30.0 - 36.0 g/dL   RDW 12.5 11.5 - 15.5 %   Platelets 207 150 - 400 K/uL   Imaging Studies: No results found.  ED COURSE  Nursing notes and initial vitals signs, including pulse oximetry, reviewed.  Vitals:   01/07/18 2131 01/07/18 2341  BP: 124/79 124/65  Pulse: 71 66  Resp: 18 16  Temp: 99 F (37.2 C)   TempSrc: Oral   SpO2: 100% 100%  Weight: 61.2 kg (135 lb)   Height: 5' 8"  (1.727 m)    1:27 AM Patient left telling his nurse he could no longer wait.  I was unable to give him discharge instructions or prescriptions.  PROCEDURES    ED DIAGNOSES     ICD-10-CM   1. Left upper quadrant pain R10.12        Shanon Rosser, MD 01/08/18 [phone]

## 2018-01-18 ENCOUNTER — Encounter: Payer: Self-pay | Admitting: Gastroenterology

## 2018-01-18 ENCOUNTER — Ambulatory Visit (AMBULATORY_SURGERY_CENTER): Payer: Self-pay

## 2018-01-18 ENCOUNTER — Other Ambulatory Visit: Payer: Self-pay

## 2018-01-18 VITALS — Ht 68.0 in | Wt 138.6 lb

## 2018-01-18 DIAGNOSIS — R109 Unspecified abdominal pain: Secondary | ICD-10-CM

## 2018-01-18 NOTE — Progress Notes (Signed)
Denies allergies to eggs or soy products. Denies complication of anesthesia or sedation. Denies use of weight loss medication. Denies use of O2.   Emmi instructions declined.  

## 2018-01-25 ENCOUNTER — Encounter: Payer: Self-pay | Admitting: Gastroenterology

## 2018-01-25 ENCOUNTER — Other Ambulatory Visit: Payer: Self-pay

## 2018-01-25 ENCOUNTER — Ambulatory Visit (AMBULATORY_SURGERY_CENTER): Payer: BLUE CROSS/BLUE SHIELD | Admitting: Gastroenterology

## 2018-01-25 VITALS — BP 102/58 | HR 77 | Temp 98.6°F | Resp 11 | Ht 68.0 in | Wt 138.0 lb

## 2018-01-25 DIAGNOSIS — R109 Unspecified abdominal pain: Secondary | ICD-10-CM

## 2018-01-25 DIAGNOSIS — K2970 Gastritis, unspecified, without bleeding: Secondary | ICD-10-CM

## 2018-01-25 DIAGNOSIS — K2950 Unspecified chronic gastritis without bleeding: Secondary | ICD-10-CM | POA: Diagnosis not present

## 2018-01-25 MED ORDER — SODIUM CHLORIDE 0.9 % IV SOLN: 500.0000 mL | Freq: Once | INTRAVENOUS | Status: DC

## 2018-01-25 NOTE — Progress Notes (Signed)
Called to room to assist during endoscopic procedure.  Patient ID and intended procedure confirmed with present staff. Received instructions for my participation in the procedure from the performing physician.  

## 2018-01-25 NOTE — Patient Instructions (Signed)
INFORMATION GIVEN ON GASTRITIS   YOU HAD AN ENDOSCOPIC PROCEDURE TODAY AT [facility]:   Refer to the procedure report that was given to you for any specific questions about what was found during the examination.  If the procedure report does not answer your questions, please call your gastroenterologist to clarify.  If you requested that your care partner not be given the details of your procedure findings, then the procedure report has been included in a sealed envelope for you to review at your convenience later.  YOU SHOULD EXPECT: Some feelings of bloating in the abdomen. Passage of more gas than usual.  Walking can help get rid of the air that was put into your GI tract during the procedure and reduce the bloating. If you had a lower endoscopy (such as a colonoscopy or flexible sigmoidoscopy) you may notice spotting of blood in your stool or on the toilet paper. If you underwent a bowel prep for your procedure, you may not have a normal bowel movement for a few days.  Please Note:  You might notice some irritation and congestion in your nose or some drainage.  This is from the oxygen used during your procedure.  There is no need for concern and it should clear up in a day or so.  SYMPTOMS TO REPORT IMMEDIATELY:   Following upper endoscopy (EGD)  Vomiting of blood or coffee ground material  New chest pain or pain under the shoulder blades  Painful or persistently difficult swallowing  New shortness of breath  Fever of 100F or higher  Black, tarry-looking stools  For urgent or emergent issues, a gastroenterologist can be reached at any hour by calling [phone].   DIET:  We do recommend a small meal at first, but then you may proceed to your regular diet.  Drink plenty of fluids but you should avoid alcoholic beverages for 24 hours.  ACTIVITY:  You should plan to take it easy for the rest of today and you should NOT DRIVE or use heavy machinery until tomorrow  (because of the sedation medicines used during the test).    FOLLOW UP: Our staff will call the number listed on your records the next business day following your procedure to check on you and address any questions or concerns that you may have regarding the information given to you following your procedure. If we do not reach you, we will leave a message.  However, if you are feeling well and you are not experiencing any problems, there is no need to return our call.  We will assume that you have returned to your regular daily activities without incident.  If any biopsies were taken you will be contacted by phone or by letter within the next 1-3 weeks.  Please call us at [phone] if you have not heard about the biopsies in 3 weeks.    SIGNATURES/CONFIDENTIALITY: You and/or your care partner have signed paperwork which will be entered into your electronic medical record.  These signatures attest to the fact that that the information above on your After Visit Summary has been reviewed and is understood.  Full responsibility of the confidentiality of this discharge information lies with you and/or your care-partner.

## 2018-01-25 NOTE — Op Note (Signed)
[facility] Patient Name: Aaron Ramsey Procedure Date: 01/25/2018 9:48 AM MRN: [id number] Endoscopist: Milus Banister , MD Age: 27 Referring MD:  Date of Birth: 05-08-1991 Gender: Male Account #: [id number] Procedure:                Upper GI endoscopy Indications:              Epigastric abdominal pain, Abdominal pain in the                            left upper quadrant; weight loss. Labs and imaging                            (CT scan) normal Medicines:                Monitored Anesthesia Care Procedure:                Pre-Anesthesia Assessment:                           - Prior to the procedure, a History and Physical                            was performed, and patient medications and                            allergies were reviewed. The patient's tolerance of                            previous anesthesia was also reviewed. The risks                            and benefits of the procedure and the sedation                            options and risks were discussed with the patient.                            All questions were answered, and informed consent                            was obtained. Prior Anticoagulants: The patient has                            taken no previous anticoagulant or antiplatelet                            agents. ASA Grade Assessment: II - A patient with                            mild systemic disease. After reviewing the risks                            and benefits, the patient was deemed in  satisfactory condition to undergo the procedure.                           After obtaining informed consent, the endoscope was                            passed under direct vision. Throughout the                            procedure, the patient's blood pressure, pulse, and                            oxygen saturations were monitored continuously. The                            Model GIF-HQ190 [id number]) scope  was introduced                            through the mouth, and advanced to the second part                            of duodenum. The upper GI endoscopy was                            accomplished without difficulty. The patient                            tolerated the procedure well. Scope In: Scope Out: Findings:                 Mild inflammation characterized by erythema was                            found in the gastric antrum. Biopsies were taken                            with a cold forceps for histology.                           The exam was otherwise without abnormality. Complications:            No immediate complications. Estimated blood loss:                            None. Estimated Blood Loss:     Estimated blood loss: none. Impression:               - Mild gastritis, biospied to check for H. pylori.                           - The examination was otherwise normal. Recommendation:           - Patient has a contact number available for                            emergencies. The signs and symptoms of potential  delayed complications were discussed with the                            patient. Return to normal activities tomorrow.                            Written discharge instructions were provided to the                            patient.                           - Resume previous diet.                           - Continue present medications.                           - Await pathology results. If negative for H.                            pylori, will likely arrange further testing (RUQ [place]                            ?) Milus Banister, MD 01/25/2018 10:04:32 AM This report has been signed electronically.

## 2018-01-25 NOTE — Progress Notes (Signed)
No changes in medical or surgical hx since per pt.

## 2018-01-25 NOTE — Progress Notes (Signed)
Report to PACU, RN, vss, BBS= Clear.  

## 2018-01-28 ENCOUNTER — Telehealth: Payer: Self-pay

## 2018-01-28 NOTE — Telephone Encounter (Signed)
Left message

## 2018-01-31 ENCOUNTER — Telehealth: Payer: Self-pay | Admitting: Gastroenterology

## 2018-01-31 NOTE — Telephone Encounter (Signed)
The patient has been notified that the path results are not yet available and we will contact him as soon as resulted.

## 2018-02-11 ENCOUNTER — Encounter: Payer: Self-pay | Admitting: Physician Assistant

## 2018-02-11 ENCOUNTER — Telehealth: Payer: Self-pay | Admitting: *Deleted

## 2018-02-11 ENCOUNTER — Encounter: Payer: Self-pay | Admitting: Gastroenterology

## 2018-02-11 ENCOUNTER — Telehealth: Payer: Self-pay | Admitting: Gastroenterology

## 2018-02-11 DIAGNOSIS — R1084 Generalized abdominal pain: Principal | ICD-10-CM

## 2018-02-11 DIAGNOSIS — G8929 Other chronic pain: Secondary | ICD-10-CM

## 2018-02-11 NOTE — Telephone Encounter (Signed)
Copied from [place] [id number]. Topic: Referral - Request >> Feb 11, 2018 12:57 PM Scherrie Gerlach wrote: Reason for CRM: pt states he saw [name] in Jan for stomach issues, and it has gotten worse.  Pt cannot get into GI until august.  Pt has not made appt with anyone yet. ([facility] or [facility]) Pt would like a referral to an allergist, to be tested for seliac disease , or any other stomach issues. Pt just wants to see someone asap to rule out everything. Pt states it is effecting his ability to work.

## 2018-02-11 NOTE — Telephone Encounter (Signed)
Notes recorded by Timothy Lasso, RN on 02/01/2018 at 9:57 AM EDT Patient called. Patient aware. The pt states the pain has resolved and will call back if symptoms return ------  Notes recorded by Milus Banister, MD on 02/01/2018 at 7:48 AM EDT  Vernor Monnig, please call him. Bx from stomach show no sign of h. Pylori. He needs abdominal US to continue the workup for LUQ pains thakns  Santiago Glad, no result note is needed.

## 2018-02-11 NOTE — Telephone Encounter (Signed)
Left message on machine to call back  

## 2018-02-12 NOTE — Addendum Note (Signed)
Addended by: Leonidas Romberg on: 02/12/2018 01:08 PM   Modules accepted: Orders

## 2018-02-12 NOTE — Telephone Encounter (Signed)
Spoke with patient about GI symptoms.  He is agreeable with following the gluten free diet.  Per PCP we can do Celiac testing with blood work. Patient does not have an upcoming appt with GI due to appts being out until August.  Lab orders placed.

## 2018-02-12 NOTE — Telephone Encounter (Signed)
Pt said he is returning your call [phone]

## 2018-02-12 NOTE — Telephone Encounter (Signed)
Left message on machine to call back  

## 2018-02-12 NOTE — Telephone Encounter (Signed)
CT, [place], bloodwork, EGD have all been unrevealing.  I am not sure what is causing his pains.  I see he asked about Celiac Spure testing, lets get tTG and total IgA level checked.  Disability questions should go through his PCP.  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  Thanks

## 2018-02-12 NOTE — Telephone Encounter (Signed)
    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  He states he was seen at [facility] ER from work last week due to the pain and an [place] of his gallbladder was completed.  (see care everywhere) He also had labs. He is requesting short term disability because he says he is not able to work.  I advised him he would need to have the forms sent to [employer] (medical records) to have completed if appropriate.  He wants to know what else can be done for the pain.  Please advise

## 2018-02-13 ENCOUNTER — Other Ambulatory Visit (INDEPENDENT_AMBULATORY_CARE_PROVIDER_SITE_OTHER): Payer: BLUE CROSS/BLUE SHIELD

## 2018-02-13 DIAGNOSIS — G8929 Other chronic pain: Secondary | ICD-10-CM

## 2018-02-13 DIAGNOSIS — R1084 Generalized abdominal pain: Secondary | ICD-10-CM

## 2018-02-13 NOTE — Telephone Encounter (Signed)
Left message on machine to call back  

## 2018-02-14 ENCOUNTER — Encounter: Payer: Self-pay | Admitting: Physician Assistant

## 2018-02-14 NOTE — Telephone Encounter (Signed)
The pt has been advised and will have test on 03/01/18.  All questions answered      You have been scheduled for a HIDA scan at [facility]iology (1st floor) on 03/01/18. Please arrive 15 minutes prior to your scheduled appointment at  9 am. Make certain not to have anything to eat or drink at least 6 hours prior to your test. Should this appointment date or time not work well for you, please call radiology scheduling at [phone].  _____________________________________________________________________ hepatobiliary (HIDA) scan is an imaging procedure used to diagnose problems in the liver, gallbladder and bile ducts. In the HIDA scan, a radioactive chemical or tracer is injected into a vein in your arm. The tracer is handled by the liver like bile. Bile is a fluid produced and excreted by your liver that helps your digestive system break down fats in the foods you eat. Bile is stored in your gallbladder and the gallbladder releases the bile when you eat a meal. A special nuclear medicine scanner (gamma camera) tracks the flow of the tracer from your liver into your gallbladder and small intestine.  During your HIDA scan  You'll be asked to change into a hospital gown before your HIDA scan begins. Your health care team will position you on a table, usually on your back. The radioactive tracer is then injected into a vein in your arm.The tracer travels through your bloodstream to your liver, where it's taken up by the bile-producing cells. The radioactive tracer travels with the bile from your liver into your gallbladder and through your bile ducts to your small intestine.You may feel some pressure while the radioactive tracer is injected into your vein. As you lie on the table, a special gamma camera is positioned over your abdomen taking pictures of the tracer as it moves through your body. The gamma camera takes pictures continually for about an hour. You'll need to keep still during the HIDA scan. This  can become uncomfortable, but you may find that you can lessen the discomfort by taking deep breaths and thinking about other things. Tell your health care team if you're uncomfortable. The radiologist will watch on a computer the progress of the radioactive tracer through your body. The HIDA scan may be stopped when the radioactive tracer is seen in the gallbladder and enters your small intestine. This typically takes about an hour. In some cases extra imaging will be performed if original images aren't satisfactory, if morphine is given to help visualize the gallbladder or if the medication CCK is given to look at the contraction of the gallbladder. This test typically takes 2 1/2 hours to complete. _______________________________________________________________________

## 2018-02-14 NOTE — Telephone Encounter (Signed)
Left message on machine to call back  

## 2018-02-20 LAB — CELIAC PNL 2 RFLX ENDOMYSIAL AB TTR
(TTG) AB, IGA: 24 U/mL — AB
(tTG) Ab, IgG: 12 U/mL — ABNORMAL HIGH
Endomysial Ab IgA: POSITIVE — AB
Endomysial Titer: 1:5 {titer} — ABNORMAL HIGH
Gliadin(Deam) Ab,IgA: 33 U — ABNORMAL HIGH (ref <20)
Gliadin(Deam) Ab,IgG: 81 U — ABNORMAL HIGH (ref <20)
Immunoglobulin A: 266 mg/dL (ref 81–463)

## 2018-02-22 ENCOUNTER — Telehealth: Payer: Self-pay | Admitting: Physician Assistant

## 2018-02-22 ENCOUNTER — Telehealth: Payer: Self-pay | Admitting: Gastroenterology

## 2018-02-22 ENCOUNTER — Other Ambulatory Visit: Payer: Self-pay | Admitting: Gastroenterology

## 2018-02-22 DIAGNOSIS — K900 Celiac disease: Secondary | ICD-10-CM

## 2018-02-22 DIAGNOSIS — Z0279 Encounter for issue of other medical certificate: Secondary | ICD-10-CM

## 2018-02-22 NOTE — Telephone Encounter (Signed)
Received papework for "Concurrent Disability and Leave" , via fax, from [employer]. Reqesting for forms to be reviewed, completed and faxed back. Placed in front bin with charge sheet.

## 2018-02-22 NOTE — Telephone Encounter (Signed)
I spoke with patient to inform him of his Celiac testing. A referral was sent to discuss his diet needs and they will contact him with an appointment. Patient will follow up in office to see how he is doing in 2 months. HIDA scan was cancelled for June. Patient voiced understanding with all information.

## 2018-02-22 NOTE — Telephone Encounter (Signed)
-----   Message from Aaron Banister, MD sent at 02/22/2018  7:51 AM EDT ----- Thanks for sending this.  Good pick up!  I should have biopsied his duodenum during EGD.  [name],  Can you please call him. Let him know we saw the Celiac testing and it is clearly positive.  He needs to avoid gluten, usually sitting down with a dietician is helpful and offer that referral please.   I would like to see him in the office in 2 months to see how he is doing with that. Also please cancel his upcoming HIDA scan, that is not necessary now.

## 2018-02-22 NOTE — Telephone Encounter (Signed)
Form given to provider

## 2018-02-25 ENCOUNTER — Encounter: Payer: Self-pay | Admitting: Physician Assistant

## 2018-02-25 DIAGNOSIS — K900 Celiac disease: Secondary | ICD-10-CM | POA: Insufficient documentation

## 2018-02-25 NOTE — Telephone Encounter (Signed)
Forms faxed

## 2018-02-25 NOTE — Telephone Encounter (Signed)
Forms completed. Ready to be faxed. Please fax and contact patient.

## 2018-03-01 ENCOUNTER — Ambulatory Visit (HOSPITAL_COMMUNITY): Payer: BLUE CROSS/BLUE SHIELD

## 2018-03-12 ENCOUNTER — Encounter: Payer: Self-pay | Admitting: Physician Assistant

## 2018-03-16 ENCOUNTER — Other Ambulatory Visit: Payer: Self-pay | Admitting: Physician Assistant

## 2018-04-04 ENCOUNTER — Encounter: Payer: BLUE CROSS/BLUE SHIELD | Attending: Gastroenterology | Admitting: Dietician

## 2018-04-04 ENCOUNTER — Encounter

## 2018-04-04 ENCOUNTER — Encounter: Payer: Self-pay | Admitting: Dietician

## 2018-04-04 DIAGNOSIS — Z713 Dietary counseling and surveillance: Secondary | ICD-10-CM | POA: Insufficient documentation

## 2018-04-04 DIAGNOSIS — K900 Celiac disease: Secondary | ICD-10-CM | POA: Diagnosis not present

## 2018-04-04 NOTE — Patient Instructions (Signed)
Choose an alternative to the [facility] bars such as the LARA bar, GF kind bar, homemade GF banana bread or others. Continue to choose foods that are gluten free and work to increase the variety in your diet.  Investigate areas of cross contamination (toaster, oats, griddle, measuring cups, sugar if a cup that contained flour was used in it, medications, vitamins, eating out).  Resources: https://gomez-solis.com/ [facility] or Orlin Hilding

## 2018-04-04 NOTE — Progress Notes (Signed)
  Medical Nutrition Therapy:  Appt start time: 0900 end time:  1000.   Assessment:  Primary concerns today: Patient is here today related to newly diagnosed celiac disease 2 months ago.  He has researched some about gluten free and has been on a gluten free diet since May 19.  He has gotten some cross contamination from home and restaurants.  Weight hx: 145 lbs in January 2019 125 lbs in May 2019 130 lbs today  Patient lives with his wife.  They share shopping and cooking.  He works from 3 pm-2 am at [employer].   Preferred Learning Style:   No preference indicated   Learning Readiness:   Ready  Change in progress   MEDICATIONS: see list   DIETARY INTAKE: Usual eating pattern includes 3-4 meals and 1-2 snacks per day.  Everyday foods include granola bars.  Avoided foods include trying to avoid gluten containing products..   Meal preps as able on Sunday.  24-hr recall:  B (3 am and or 11 AM): protein shake (2 grams carbs and 22 grams protein) or smoothie with fruit and greens or [employer] bar OR grilled chicken and rice, vegetable Snk ( AM):   L ( PM): Chicken, potatoes or rice and vegetables Snk ( PM):  D ( PM): chicken, potatoes or rice and vegetables Snk ( PM): granola bar ([place]), banana, GF banana bread Beverages: water, 1 Dr. Malachi Bonds per day  Usual physical activity:   Estimated energy needs: 2200-2300 calories 90 g protein  Progress Towards Goal(s):  In progress.   Nutritional Diagnosis:  NB-1.1 Food and nutrition-related knowledge deficit As related to gluten free diet for celiac disease.  As evidenced by patient report and diet history.    Intervention:  Nutrition counseling/eductaion related to gluten free diet and celiac disease.  Discussed foods to avoid, foods that are allowed and foods to increase variety in his diet as well as sources of cross contamination, label reading, and resources.  Plan: Choose an alternative to the  [facility] bars such as the LARA bar, GF kind bar, homemade GF banana bread or others. Continue to choose foods that are gluten free and work to increase the variety in your diet. Investigate areas of cross contamination (toaster, oats, griddle, measuring cups, sugar if a cup that contained flour was used in it, medications, vitamins, eating out). Resources: https://gomez-solis.com/ [employer] or Orlin Hilding  Teaching Method Utilized:  Ship broker  Handouts given during visit include:  Celiac nutrition therapy from [place] disease healthy eating tips from AND  Celiac disease label reading tips from AND  Barriers to learning/adherence to lifestyle change: none  Demonstrated degree of understanding via:  Teach Back   Monitoring/Evaluation:  Dietary intake, exercise, label reading, and body weight prn.

## 2018-04-26 ENCOUNTER — Encounter: Payer: Self-pay | Admitting: Gastroenterology

## 2018-12-10 ENCOUNTER — Ambulatory Visit: Payer: Self-pay | Admitting: *Deleted

## 2018-12-10 ENCOUNTER — Telehealth: Payer: Self-pay | Admitting: Physician Assistant

## 2018-12-10 NOTE — Telephone Encounter (Signed)
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 Called initially to see if tetanus shot due. States he realized this will be a worker's comp issue and they will send him to their MD. States wound is clean and any further eval will be done at workers comp visit. States wound is not bleeding and metal was clean. Assured it would be OK to wait until morning to be seen.

## 2018-12-25 ENCOUNTER — Encounter: Payer: Self-pay | Admitting: Physician Assistant

## 2018-12-25 ENCOUNTER — Ambulatory Visit (INDEPENDENT_AMBULATORY_CARE_PROVIDER_SITE_OTHER): Payer: Self-pay | Admitting: Physician Assistant

## 2018-12-25 ENCOUNTER — Ambulatory Visit: Payer: Self-pay | Admitting: Physician Assistant

## 2018-12-25 ENCOUNTER — Other Ambulatory Visit: Payer: Self-pay

## 2018-12-25 VITALS — Temp 98.5°F

## 2018-12-25 DIAGNOSIS — B349 Viral infection, unspecified: Secondary | ICD-10-CM

## 2018-12-25 MED ORDER — BENZONATATE 100 MG PO CAPS: 100.0000 mg | ORAL_CAPSULE | Freq: Three times a day (TID) | ORAL | 0 refills | Status: AC | PRN

## 2018-12-25 NOTE — Progress Notes (Signed)
I have discussed the procedure for the virtual visit with the patient who has given consent to proceed with assessment and treatment.   Aaron Ramsey S Demondre Aguas, CMA     

## 2018-12-25 NOTE — Patient Instructions (Signed)
Sent to MyChart  Please keep hydrated and get plenty of rest. Continue Dayquil and Nyquil for symptomatic relief. Use the Tessalon as directed for cough. If you have a humidifier put it in the bedroom and run at night.   You are to be quarantined for 7 days from yesterday. May return to work after that time if fever free for 72 hours without medication.  If you note any increase in shortness of breath or new symptoms please call me. If any severe shortness of breath please be seen at the ER.

## 2018-12-25 NOTE — Telephone Encounter (Signed)
Pt. Reports he was sent home because he has a non-productive cough. Temp. 98.5. States when tries to take a deep breath, he starts coughing. No other symptoms. Denies seasonal allergies. Reports his job is requesting a note before he can come back to work. Spoke with Levada Dy in the practice and she will speak with pt.  Answer Assessment - Initial Assessment Questions 1. COVID-19 DIAGNOSIS: "Who made your Coronavirus (COVID-19) diagnosis?" "Was it confirmed by a positive lab test?" If not diagnosed by a HCP, ask "Are there lots of cases (community spread) where you live?" (See public health department website, if unsure)   * MAJOR community spread: high number of cases; numbers of cases are increasing; many people hospitalized.   * MINOR community spread: low number of cases; not increasing; few or no people hospitalized     Minor 2. ONSET: "When did the COVID-19 symptoms start?"      Cough started 1 week ago, went away and came back yesterday 3. WORST SYMPTOM: "What is your worst symptom?" (e.g., cough, fever, shortness of breath, muscle aches)     Cough, shortness of breath. When he tries to take a deep breath, he starts coughing 4. COUGH: "How bad is the cough?"       Moderate 5. FEVER: "Do you have a fever?" If so, ask: "What is your temperature, how was it measured, and when did it start?"     98.5 6. RESPIRATORY STATUS: "Describe your breathing?" (e.g., shortness of breath, wheezing, unable to speak)      No 7. BETTER-SAME-WORSE: "Are you getting better, staying the same or getting worse compared to yesterday?"  If getting worse, ask, "In what way?"     Cough feels a "little worse." 8. HIGH RISK DISEASE: "Do you have any chronic medical problems?" (e.g., asthma, heart or lung disease, weak immune system, etc.)     Had asthma as a child 9. PREGNANCY: "Is there any chance you are pregnant?" "When was your last menstrual period?"     n/a 10. OTHER SYMPTOMS: "Do you have any other symptoms?"   (e.g., runny nose, headache, sore throat, loss of smell)       Sore throat  Protocols used: CORONAVIRUS (COVID-19) DIAGNOSED OR SUSPECTED-A-AH

## 2018-12-25 NOTE — Progress Notes (Signed)
Virtual Visit via Video Note  I connected with Ila Mcgill on 12/25/18 at 10:30 AM EDT by a video enabled telemedicine application and verified that I am speaking with the correct person using two identifiers.   I discussed the limitations of evaluation and management by telemedicine and the availability of in person appointments. The patient expressed understanding and agreed to proceed.  History of Present Illness: Patient endorses 1 week of dry cough that initially resolved then yesterday recurred with significant coughing spells, chills and aches. Unsure of fever but notes feeling fatigued. Notes some tenderness with coughing itself. Denies recent travel out of the state.150 people at his work so hard to social distance. Co-worker sent home with similar symptoms yesterday. Denies true chest congestion. Notes sore throat. Denies wheezing but notes some chest tightness. Has taken Dayquil.    Observations/Objective: Well-developed, well-nourished in no acute distress, resting in bed for video visit. Patient is much more flat than normal so can tell he is not feeling well. No noted labored breathing and able to speak in complete sentences without issue.  Head is normocephalic, atraumatic.   Assessment and Plan: 1. Viral illness Possibly COVID with mild symptoms. Potential exposure at work. No SOB on examination. Supportive measures and OTC medications reviewed. Rx Tessalon. Patient to quarantine per guidelines discussed. Copy sent to myChart. Wife working from home so she is instructed to continue that and self quarantine for 14 days since asymptomatic.  Follow Up Instructions: Quarantine instructions given -- sent to [place] Work note sent to [employer]. Strict ER precautions reviewed.   I discussed the assessment and treatment plan with the patient. The patient was provided an opportunity to ask questions and all were answered. The patient agreed with the plan and demonstrated an  understanding of the instructions.   The patient was advised to call back or seek an in-person evaluation if the symptoms worsen or if the condition fails to improve as anticipated.   Leeanne Rio, PA-C

## 2018-12-30 ENCOUNTER — Encounter: Payer: Self-pay | Admitting: Physician Assistant

## 2019-02-25 ENCOUNTER — Encounter: Payer: Self-pay | Admitting: Physician Assistant

## 2020-03-08 IMAGING — CT CT ABD-PELV W/ CM
2 of 4 series · 16 of 46 positions shown, 18 images · IV contrast (iopamidol)
Comparison: None.

CLINICAL DATA: Lower abdominal pain. Bloating and diarrhea. Recent
abnormal weight loss.

EXAM:
CT ABDOMEN AND PELVIS WITH CONTRAST
TECHNIQUE: Multidetector CT imaging of the abdomen and pelvis was performed
using the standard protocol following bolus administration of
intravenous contrast.
CONTRAST:  100mL [id number] IOPAMIDOL ([id number]) INJECTION 61%

[Series 2: abd/pel w · axial · 0.62mm/px · z∈[-468,-63]mm · 13 of 89 slices shown, 15 images]
[im 4/89  soft-tissue]
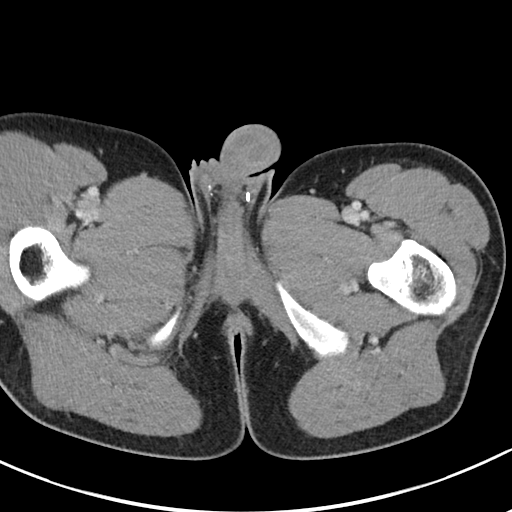
[im 4/89  bone]
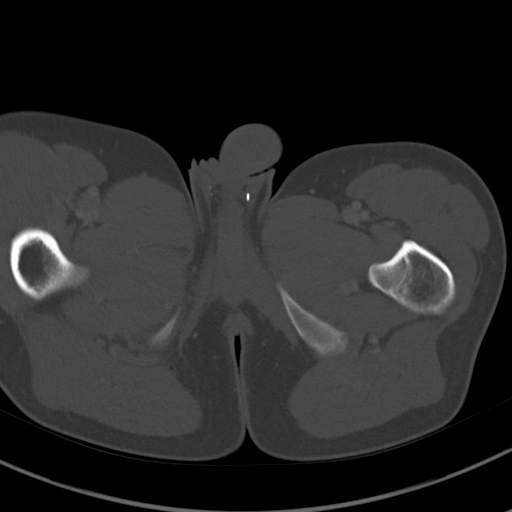
[im 12/89  soft-tissue]
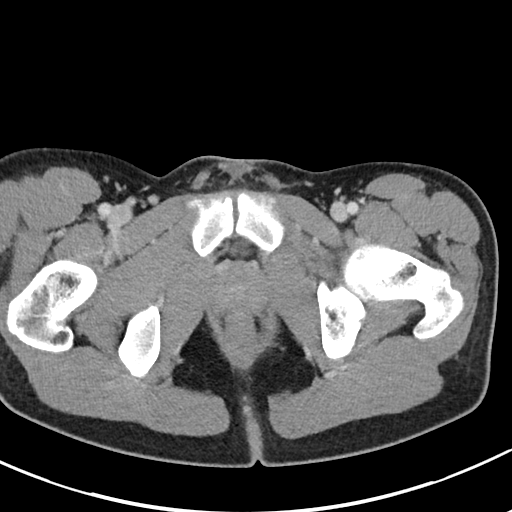
[im 19/89  soft-tissue]
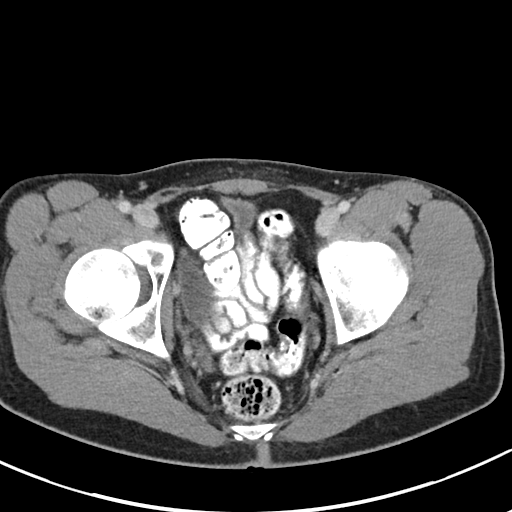
[im 26/89  soft-tissue]
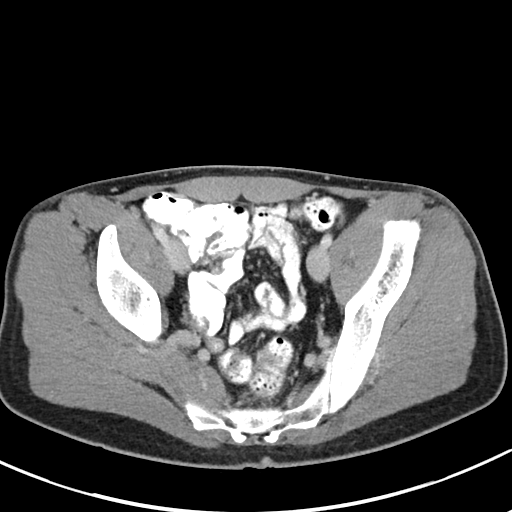
[im 30/89  soft-tissue]
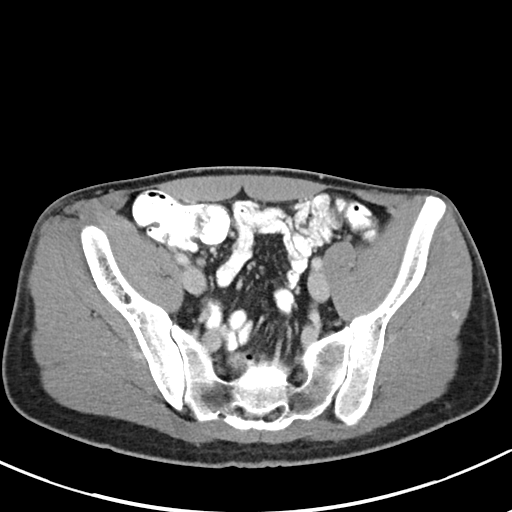
[im 37/89  soft-tissue]
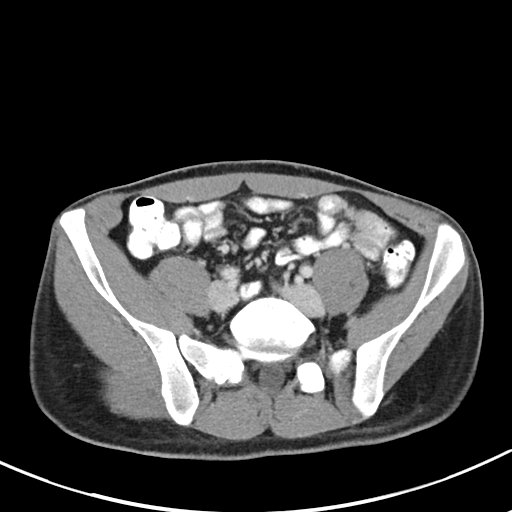
[im 45/89  soft-tissue]
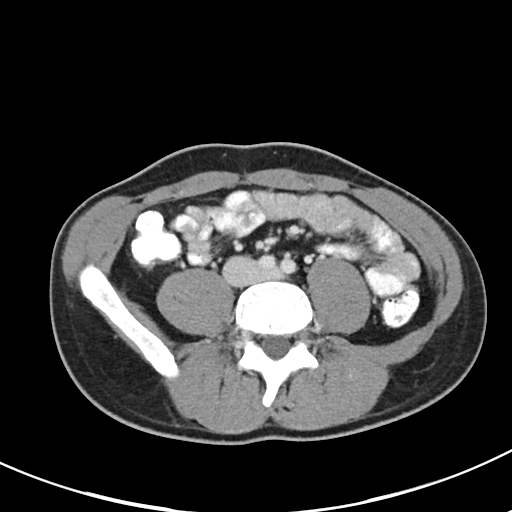
[im 52/89  soft-tissue]
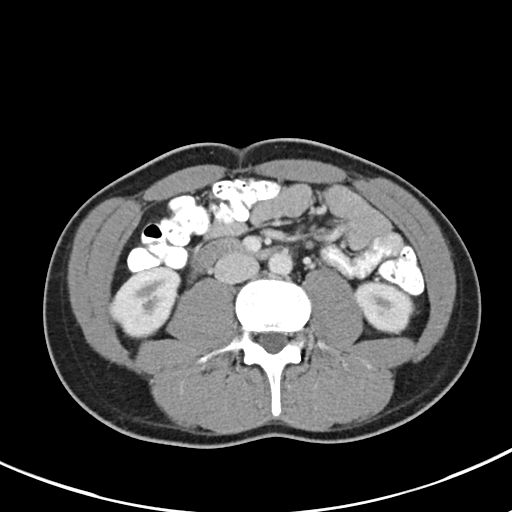
[im 59/89  soft-tissue]
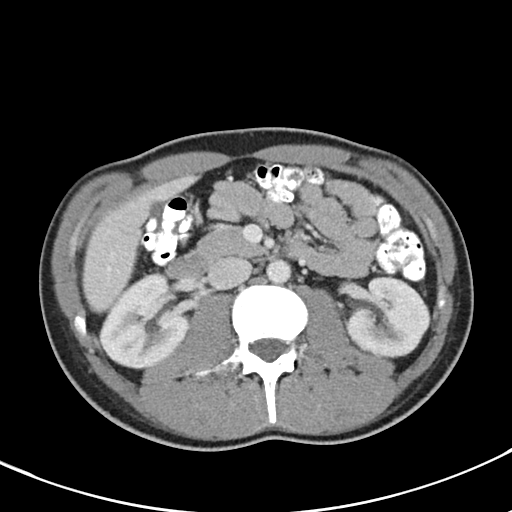
[im 59/89  bone]
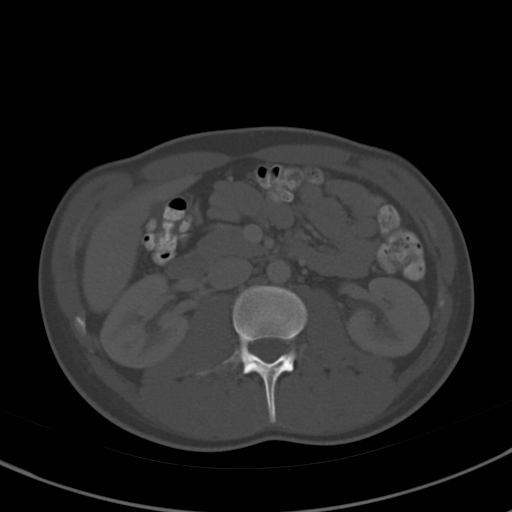
[im 63/89  soft-tissue]
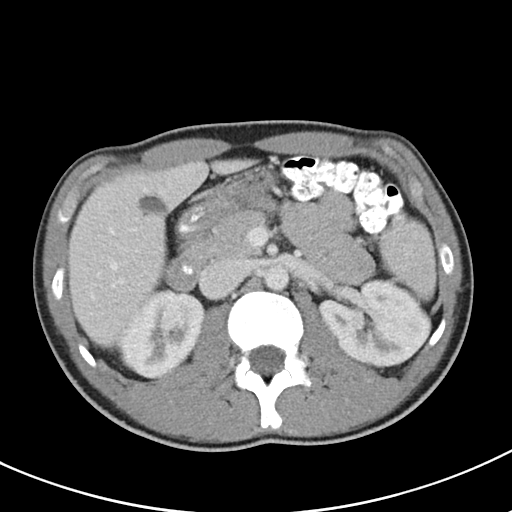
[im 70/89  soft-tissue]
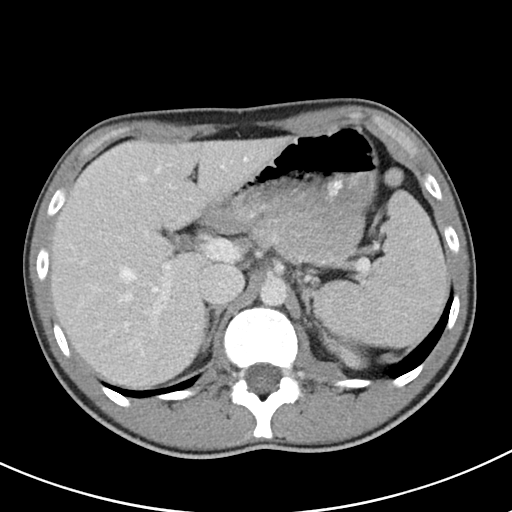
[im 78/89  soft-tissue]
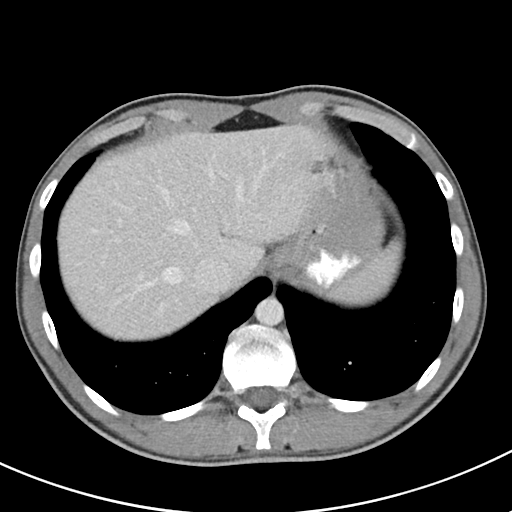
[im 85/89  soft-tissue]
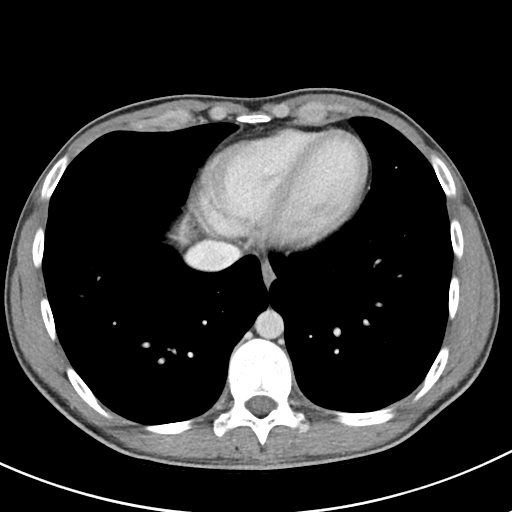

[Series 5: abd/pel w st · coronal · 0.64mm/px · 3 of 68 slices shown]
[im 23/68  soft-tissue]
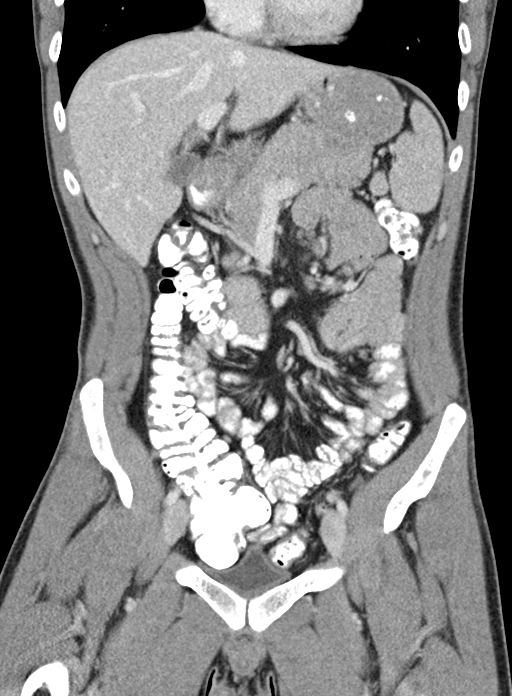
[im 30/68  soft-tissue]
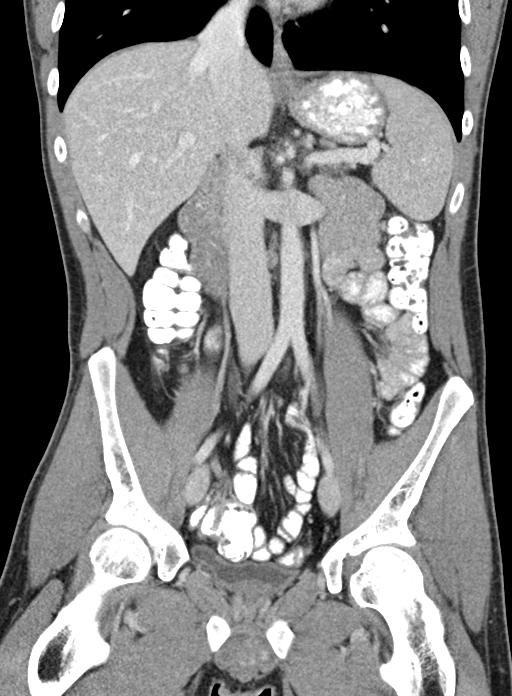
[im 38/68  soft-tissue]
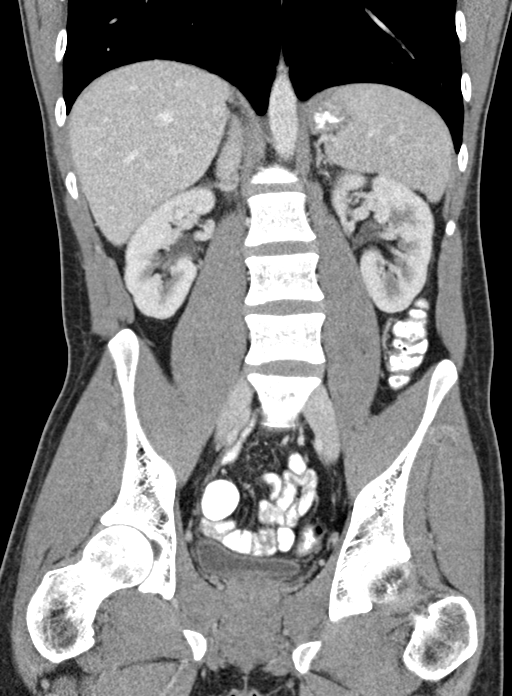

[16 of 46 positions shown; findings below may reference images not displayed]

FINDINGS: Lower Chest: No acute findings.

Hepatobiliary: No hepatic masses identified. Gallbladder is
unremarkable.

Pancreas:  No mass or inflammatory changes.

Spleen: Within normal limits in size and appearance.

Adrenals/Urinary Tract: No masses identified. No evidence of
hydronephrosis.

Stomach/Bowel: No evidence of obstruction, inflammatory process or
abnormal fluid collections. Normal appendix visualized.

Vascular/Lymphatic: No pathologically enlarged lymph nodes. No
abdominal aortic aneurysm.

Reproductive:  No mass or other significant abnormality.

Other:  None.

Musculoskeletal:  No suspicious bone lesions identified.
IMPRESSION: Negative.  No acute findings or other significant abnormality.
